# Patient Record
Sex: Female | Born: 2000
Health system: Southern US, Community
[De-identification: ages and names within clinical notes are randomized; demographics above are authoritative.]

---
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
## 2018-01-17 DIAGNOSIS — J301 Allergic rhinitis due to pollen: Secondary | ICD-10-CM | POA: Diagnosis not present

## 2018-02-06 DIAGNOSIS — J301 Allergic rhinitis due to pollen: Secondary | ICD-10-CM | POA: Diagnosis not present

## 2018-03-05 DIAGNOSIS — J301 Allergic rhinitis due to pollen: Secondary | ICD-10-CM | POA: Diagnosis not present

## 2018-04-04 DIAGNOSIS — J301 Allergic rhinitis due to pollen: Secondary | ICD-10-CM | POA: Diagnosis not present

## 2018-05-16 DIAGNOSIS — J301 Allergic rhinitis due to pollen: Secondary | ICD-10-CM | POA: Diagnosis not present

## 2018-06-04 DIAGNOSIS — R69 Illness, unspecified: Secondary | ICD-10-CM | POA: Diagnosis not present

## 2018-08-17 DIAGNOSIS — R102 Pelvic and perineal pain: Secondary | ICD-10-CM | POA: Diagnosis not present

## 2018-08-17 DIAGNOSIS — N3000 Acute cystitis without hematuria: Secondary | ICD-10-CM | POA: Diagnosis not present

## 2018-10-01 DIAGNOSIS — R69 Illness, unspecified: Secondary | ICD-10-CM | POA: Diagnosis not present

## 2018-10-23 DIAGNOSIS — Z20828 Contact with and (suspected) exposure to other viral communicable diseases: Secondary | ICD-10-CM | POA: Diagnosis not present

## 2018-11-15 DIAGNOSIS — N946 Dysmenorrhea, unspecified: Secondary | ICD-10-CM | POA: Diagnosis not present

## 2018-11-15 DIAGNOSIS — Z3041 Encounter for surveillance of contraceptive pills: Secondary | ICD-10-CM | POA: Diagnosis not present

## 2018-11-15 DIAGNOSIS — Z23 Encounter for immunization: Secondary | ICD-10-CM | POA: Diagnosis not present

## 2018-11-15 DIAGNOSIS — N930 Postcoital and contact bleeding: Secondary | ICD-10-CM | POA: Diagnosis not present

## 2018-11-15 DIAGNOSIS — R69 Illness, unspecified: Secondary | ICD-10-CM | POA: Diagnosis not present

## 2019-01-21 DIAGNOSIS — N939 Abnormal uterine and vaginal bleeding, unspecified: Secondary | ICD-10-CM | POA: Diagnosis not present

## 2019-03-01 DIAGNOSIS — S99911A Unspecified injury of right ankle, initial encounter: Secondary | ICD-10-CM | POA: Diagnosis not present

## 2019-03-01 DIAGNOSIS — S93401A Sprain of unspecified ligament of right ankle, initial encounter: Secondary | ICD-10-CM | POA: Diagnosis not present

## 2019-03-01 DIAGNOSIS — W1839XA Other fall on same level, initial encounter: Secondary | ICD-10-CM | POA: Diagnosis not present

## 2019-03-01 DIAGNOSIS — W19XXXA Unspecified fall, initial encounter: Secondary | ICD-10-CM | POA: Diagnosis not present

## 2019-03-21 DIAGNOSIS — R87610 Atypical squamous cells of undetermined significance on cytologic smear of cervix (ASC-US): Secondary | ICD-10-CM | POA: Diagnosis not present

## 2019-03-21 DIAGNOSIS — N939 Abnormal uterine and vaginal bleeding, unspecified: Secondary | ICD-10-CM | POA: Diagnosis not present

## 2019-03-21 DIAGNOSIS — N889 Noninflammatory disorder of cervix uteri, unspecified: Secondary | ICD-10-CM | POA: Diagnosis not present

## 2019-03-21 DIAGNOSIS — R69 Illness, unspecified: Secondary | ICD-10-CM | POA: Diagnosis not present

## 2019-03-21 DIAGNOSIS — N760 Acute vaginitis: Secondary | ICD-10-CM | POA: Diagnosis not present

## 2019-03-21 DIAGNOSIS — Z124 Encounter for screening for malignant neoplasm of cervix: Secondary | ICD-10-CM | POA: Diagnosis not present

## 2019-04-08 DIAGNOSIS — N939 Abnormal uterine and vaginal bleeding, unspecified: Secondary | ICD-10-CM | POA: Diagnosis not present

## 2019-04-08 DIAGNOSIS — B373 Candidiasis of vulva and vagina: Secondary | ICD-10-CM | POA: Diagnosis not present

## 2019-04-19 DIAGNOSIS — R87610 Atypical squamous cells of undetermined significance on cytologic smear of cervix (ASC-US): Secondary | ICD-10-CM | POA: Diagnosis not present

## 2019-06-11 DIAGNOSIS — R69 Illness, unspecified: Secondary | ICD-10-CM | POA: Diagnosis not present

## 2019-06-24 DIAGNOSIS — Z30430 Encounter for insertion of intrauterine contraceptive device: Secondary | ICD-10-CM | POA: Diagnosis not present

## 2019-06-24 DIAGNOSIS — H5213 Myopia, bilateral: Secondary | ICD-10-CM | POA: Diagnosis not present
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
## 2019-08-06 DIAGNOSIS — Z30431 Encounter for routine checking of intrauterine contraceptive device: Secondary | ICD-10-CM | POA: Diagnosis not present

## 2019-12-04 DIAGNOSIS — L989 Disorder of the skin and subcutaneous tissue, unspecified: Secondary | ICD-10-CM | POA: Diagnosis not present

## 2019-12-04 DIAGNOSIS — L700 Acne vulgaris: Secondary | ICD-10-CM | POA: Diagnosis not present

## 2020-07-03 DIAGNOSIS — R87610 Atypical squamous cells of undetermined significance on cytologic smear of cervix (ASC-US): Secondary | ICD-10-CM | POA: Diagnosis not present

## 2020-07-03 DIAGNOSIS — Z113 Encounter for screening for infections with a predominantly sexual mode of transmission: Secondary | ICD-10-CM | POA: Diagnosis not present

## 2020-07-03 DIAGNOSIS — R87612 Low grade squamous intraepithelial lesion on cytologic smear of cervix (LGSIL): Secondary | ICD-10-CM | POA: Diagnosis not present

## 2020-07-03 DIAGNOSIS — Z01419 Encounter for gynecological examination (general) (routine) without abnormal findings: Secondary | ICD-10-CM | POA: Diagnosis not present

## 2020-07-03 DIAGNOSIS — R69 Illness, unspecified: Secondary | ICD-10-CM | POA: Diagnosis not present

## 2020-11-06 DIAGNOSIS — R12 Heartburn: Secondary | ICD-10-CM | POA: Diagnosis not present

## 2020-11-06 DIAGNOSIS — R109 Unspecified abdominal pain: Secondary | ICD-10-CM | POA: Diagnosis not present

## 2020-11-06 DIAGNOSIS — R1110 Vomiting, unspecified: Secondary | ICD-10-CM | POA: Diagnosis not present

## 2020-11-12 DIAGNOSIS — Z0000 Encounter for general adult medical examination without abnormal findings: Secondary | ICD-10-CM | POA: Diagnosis not present

## 2022-06-17 DIAGNOSIS — Y92480 Sidewalk as the place of occurrence of the external cause: Secondary | ICD-10-CM | POA: Diagnosis not present

## 2022-06-17 DIAGNOSIS — S93402A Sprain of unspecified ligament of left ankle, initial encounter: Secondary | ICD-10-CM | POA: Diagnosis not present

## 2022-06-17 DIAGNOSIS — S99912A Unspecified injury of left ankle, initial encounter: Secondary | ICD-10-CM | POA: Diagnosis not present

## 2022-06-17 DIAGNOSIS — X500XXA Overexertion from strenuous movement or load, initial encounter: Secondary | ICD-10-CM | POA: Diagnosis not present
